# Patient Record
Sex: Male | Born: 1955 | Race: White | Hispanic: No | Marital: Married | Smoking: Never smoker
Health system: Southern US, Community
[De-identification: ages and names within clinical notes are randomized; demographics above are authoritative.]

## PROBLEM LIST (undated history)

## (undated) DIAGNOSIS — I10 Essential (primary) hypertension: Secondary | ICD-10-CM

## (undated) HISTORY — PX: CHOLECYSTECTOMY: SHX55

## (undated) HISTORY — PX: HERNIA REPAIR: SHX51

---

## 2018-04-19 ENCOUNTER — Emergency Department (HOSPITAL_COMMUNITY)
Admission: EM | Admit: 2018-04-19 | Discharge: 2018-04-19 | Disposition: A | Discharge status: Home / Self Care | Payer: Worker's Compensation | Attending: Emergency Medicine | Admitting: Emergency Medicine

## 2018-04-19 ENCOUNTER — Emergency Department (HOSPITAL_COMMUNITY): Payer: Worker's Compensation

## 2018-04-19 ENCOUNTER — Encounter (HOSPITAL_COMMUNITY): Payer: Self-pay | Admitting: Neurology

## 2018-04-19 DIAGNOSIS — Z79899 Other long term (current) drug therapy: Secondary | ICD-10-CM | POA: Diagnosis not present

## 2018-04-19 DIAGNOSIS — Y990 Civilian activity done for income or pay: Secondary | ICD-10-CM | POA: Insufficient documentation

## 2018-04-19 DIAGNOSIS — Y9289 Other specified places as the place of occurrence of the external cause: Secondary | ICD-10-CM | POA: Insufficient documentation

## 2018-04-19 DIAGNOSIS — W228XXA Striking against or struck by other objects, initial encounter: Secondary | ICD-10-CM | POA: Diagnosis not present

## 2018-04-19 DIAGNOSIS — I10 Essential (primary) hypertension: Secondary | ICD-10-CM | POA: Diagnosis not present

## 2018-04-19 DIAGNOSIS — Z7982 Long term (current) use of aspirin: Secondary | ICD-10-CM | POA: Insufficient documentation

## 2018-04-19 DIAGNOSIS — M25562 Pain in left knee: Secondary | ICD-10-CM | POA: Insufficient documentation

## 2018-04-19 DIAGNOSIS — Y9389 Activity, other specified: Secondary | ICD-10-CM | POA: Insufficient documentation

## 2018-04-19 HISTORY — DX: Essential (primary) hypertension: I10

## 2018-04-19 LAB — COMPREHENSIVE METABOLIC PANEL
ALT: 30 U/L (ref 0–44)
AST: 26 U/L (ref 15–41)
Albumin: 4.1 g/dL (ref 3.5–5.0)
Alkaline Phosphatase: 46 U/L (ref 38–126)
Anion gap: 6 (ref 5–15)
BILIRUBIN TOTAL: 1 mg/dL (ref 0.3–1.2)
BUN: 16 mg/dL (ref 8–23)
CHLORIDE: 109 mmol/L (ref 98–111)
CO2: 24 mmol/L (ref 22–32)
CREATININE: 1.09 mg/dL (ref 0.61–1.24)
Calcium: 8.8 mg/dL — ABNORMAL LOW (ref 8.9–10.3)
Glucose, Bld: 114 mg/dL — ABNORMAL HIGH (ref 70–99)
Potassium: 3.5 mmol/L (ref 3.5–5.1)
Sodium: 139 mmol/L (ref 135–145)
TOTAL PROTEIN: 6.3 g/dL — AB (ref 6.5–8.1)

## 2018-04-19 LAB — ETHANOL

## 2018-04-19 LAB — SAMPLE TO BLOOD BANK

## 2018-04-19 LAB — I-STAT CHEM 8, ED
BUN: 20 mg/dL (ref 8–23)
CREATININE: 1.1 mg/dL (ref 0.61–1.24)
Calcium, Ion: 1.11 mmol/L — ABNORMAL LOW (ref 1.15–1.40)
Chloride: 106 mmol/L (ref 98–111)
GLUCOSE: 109 mg/dL — AB (ref 70–99)
HCT: 42 % (ref 39.0–52.0)
HEMOGLOBIN: 14.3 g/dL (ref 13.0–17.0)
POTASSIUM: 3.6 mmol/L (ref 3.5–5.1)
Sodium: 141 mmol/L (ref 135–145)
TCO2: 27 mmol/L (ref 22–32)

## 2018-04-19 LAB — CDS SEROLOGY

## 2018-04-19 LAB — CBC
HCT: 43.3 % (ref 39.0–52.0)
Hemoglobin: 14.6 g/dL (ref 13.0–17.0)
MCH: 30.1 pg (ref 26.0–34.0)
MCHC: 33.7 g/dL (ref 30.0–36.0)
MCV: 89.3 fL (ref 80.0–100.0)
NRBC: 0 % (ref 0.0–0.2)
PLATELETS: 224 10*3/uL (ref 150–400)
RBC: 4.85 MIL/uL (ref 4.22–5.81)
RDW: 12.4 % (ref 11.5–15.5)
WBC: 6.7 10*3/uL (ref 4.0–10.5)

## 2018-04-19 LAB — PROTIME-INR
INR: 0.96
Prothrombin Time: 12.7 seconds (ref 11.4–15.2)

## 2018-04-19 LAB — I-STAT CG4 LACTIC ACID, ED: LACTIC ACID, VENOUS: 1.04 mmol/L (ref 0.5–1.9)

## 2018-04-19 MED ORDER — HYDROCODONE-ACETAMINOPHEN 5-325 MG PO TABS: 1.0000 | ORAL_TABLET | Freq: Four times a day (QID) | ORAL | 0 refills | Status: AC | PRN

## 2018-04-19 MED ORDER — HYDROCODONE-ACETAMINOPHEN 5-325 MG PO TABS
1.0000 | ORAL_TABLET | Freq: Once | ORAL | Status: AC
  Administered 2018-04-19: 1 via ORAL
  Filled 2018-04-19: qty 1

## 2018-04-19 MED ORDER — KETOROLAC TROMETHAMINE 30 MG/ML IJ SOLN
30.0000 mg | Freq: Once | INTRAMUSCULAR | Status: AC
  Administered 2018-04-19: 30 mg via INTRAVENOUS
  Filled 2018-04-19: qty 1

## 2018-04-19 NOTE — ED Notes (Signed)
c-collar removed per MD. Plan to ambulate patient after pain med admin.

## 2018-04-19 NOTE — Progress Notes (Signed)
Chaplain responded to Level 2 trauma.  62 year old male pinned under equipment. Chaplain provided ministry of presence to pt and contacted his wife who is on her way.  Will follow. Lynnell ChadVirginia Berenice Oehlert  [phone]

## 2018-04-19 NOTE — ED Notes (Signed)
No crutches available in ED at this time. Ortho tech paged for crutches.

## 2018-04-19 NOTE — ED Triage Notes (Addendum)
Per ems- pt was operating a roller machine and it started rolling over so he jumped out of the cab and the cab landed on his lower extremities. He coworkers got a piece of equipment and removed the equipment that was on top of him (took about 15 mins). Has deformity to left knee, splint applied to LE. C-collar in place, c/o lower back pain. Given 150 mcg fentanyl. BP 130 systolic , HR 70. Pt was outside in the rain, clothes are wet.

## 2018-04-19 NOTE — ED Notes (Signed)
Pt ambulated a few steps in room. Able to bear weight, but painful to left knee.

## 2018-04-19 NOTE — ED Notes (Signed)
Water given  

## 2018-04-19 NOTE — ED Notes (Signed)
Pt's wife arrived at bedside, x-ray obtaining portable x-rays. Chaplain is with his wife. Pt is a x 4. Reports 2/10 pain. Pain sites=left knee, right lower leg, lower back pain.

## 2018-04-19 NOTE — Progress Notes (Signed)
Orthopedic Tech Progress Note Patient Details:  Derek Bowers [birth year] [id number]  Ortho Devices Type of Ortho Device: Knee Immobilizer Ortho Device/Splint Location: lle Ortho Device/Splint Interventions: Ordered, Application   Post Interventions Patient Tolerated: Well Instructions Provided: Care of device, Adjustment of device   [name], [name] J 04/19/2018, 1:27 PM

## 2018-04-19 NOTE — ED Provider Notes (Signed)
7:04 PM Assumed care from Dr. Dalene SeltzerSchlossman, please see their note for full history, physical and decision making until this point. In brief this is a 62 y.o. year old male who presented to the ED tonight with Trauma  Severe trauma to the left leg with persistent pain with walking so pending CT scan to look for occult fracture.  CT scan shows that the patient has likely strain versus sprain small fluid collection associated with that but no obvious fractures will give crutches, pain medicine and Artie has a knee immobilizer on.  Will need follow-up with primary doctor or orthopedics in 1 week if not improving to make sure that he gets an MRI.     Discharge instructions, including strict return precautions for new or worsening symptoms, given. Patient and/or family verbalized understanding and agreement with the plan as described.   Labs, studies and imaging reviewed by myself and considered in medical decision making if ordered. Imaging interpreted by radiology.  Labs Reviewed  COMPREHENSIVE METABOLIC PANEL - Abnormal; Notable for the following components:      Result Value   Glucose, Bld 114 (*)    Calcium 8.8 (*)    Total Protein 6.3 (*)    All other components within normal limits  I-STAT CHEM 8, ED - Abnormal; Notable for the following components:   Glucose, Bld 109 (*)    Calcium, Ion 1.11 (*)    All other components within normal limits  CBC  ETHANOL  PROTIME-INR  CDS SEROLOGY  I-STAT CG4 LACTIC ACID, ED  SAMPLE TO BLOOD BANK    CT Tibia Fibula Left Wo Contrast  Final Result    DG Tibia/Fibula Right  Final Result    DG Knee Left Port  Final Result    DG Pelvis Portable  Final Result    DG Chest Port 1 View  Final Result      No follow-ups on file.    Marily MemosMesner, Gael Delude, MD 04/19/18 [phone]

## 2018-04-23 NOTE — ED Provider Notes (Signed)
[facility] EMERGENCY DEPARTMENT Provider Note   CSN: [id number] Arrival date & time: 04/19/18  1212     History   Chief Complaint Chief Complaint  Patient presents with  . Trauma    HPI Derek Bowers is a 62 y.o. male.  HPI   Presents as a levl II trauma after he lost control of riding roller machine and he jumped approximately 3-4 feet out of the cab and the machine rolled onto his distal lower extremities.  Equipment that was on top of him removed within 15 minutes.  Had reported deformity to left knee, has sacral apain and pain to right lower leg and left knee.  No numbness. No chest pain, dyspnea, abdominal pain, n/v, weakness, headache. Denies hitting head or head trauma, denies LOC. Not on anticoagulation.    Past Medical History:  Diagnosis Date  . Hypertension     There are no active problems to display for this patient.   Past Surgical History:  Procedure Laterality Date  . CHOLECYSTECTOMY    . HERNIA REPAIR          Home Medications    Prior to Admission medications   Medication Sig Start Date End Date Taking? Authorizing Provider  alendronate (FOSAMAX) 35 MG tablet Take 35 mg by mouth once a week. Tuesday 03/12/18  Yes [provider]  amLODipine (NORVASC) 10 MG tablet Take 10 mg by mouth daily.   Yes [provider]  aspirin EC 81 MG tablet Take 81 mg by mouth daily.   Yes [provider]  atorvastatin (LIPITOR) 40 MG tablet Take 40 mg by mouth daily. 03/11/18  Yes [provider]  B Complex-C (SUPER B COMPLEX PO) Take 1 tablet by mouth daily.   Yes [provider]  cholecalciferol (VITAMIN D3) 25 MCG (1000 UT) tablet Take 1,000 Units by mouth daily.   Yes [provider]  DULoxetine (CYMBALTA) 60 MG capsule Take 60 mg by mouth daily. 02/05/18  Yes [provider]  fluticasone (FLONASE) 50 MCG/ACT nasal spray Place 1-2 sprays into both nostrils daily. 04/30/16  Yes  [provider]  loratadine (ALLERGY) 10 MG tablet Take 10 mg by mouth daily.   Yes [provider]  metoprolol succinate (TOPROL-XL) 50 MG 24 hr tablet Take 50 mg by mouth daily. 03/11/18  Yes [provider]  montelukast (SINGULAIR) 10 MG tablet Take 10 mg by mouth daily. 03/26/18  Yes [provider]  Multiple Vitamin (MULTIVITAMIN WITH MINERALS) TABS tablet Take 1 tablet by mouth daily.   Yes [provider]  Omega-3 Fatty Acids (FISH OIL) 1000 MG CAPS Take 1,000 mg by mouth daily.   Yes [provider]  Turmeric Curcumin 500 MG CAPS Take 500 mg by mouth daily.   Yes [provider]  HYDROcodone-acetaminophen (NORCO/VICODIN) 5-325 MG tablet Take 1-2 tablets by mouth every 6 (six) hours as needed for severe pain. 04/19/18   Mesner, Barbara Cower, MD    Family History No family history on file.  Social History Social History   Tobacco Use  . Smoking status: Never Smoker  . Smokeless tobacco: Never Used  Substance Use Topics  . Alcohol use: Not Currently  . Drug use: Not on file     Allergies   Codeine   Review of Systems Review of Systems  Constitutional: Negative for fever.  HENT: Negative for sore throat.   Eyes: Negative for visual disturbance.  Respiratory: Negative for shortness of breath.   Cardiovascular:  Negative for chest pain.  Gastrointestinal: Negative for abdominal pain, nausea and vomiting.  Genitourinary: Negative for difficulty urinating.  Musculoskeletal: Positive for arthralgias, back pain (low back/sacrum) and joint swelling. Negative for neck pain and neck stiffness.  Skin: Negative for rash.  Neurological: Negative for syncope, weakness, numbness and headaches.     Physical Exam Updated Vital Signs BP (!) 143/87   Pulse 66   Temp (!) 97.3 F (36.3 C) (Oral)   Resp 18   Ht 5\' 10"  (1.778 m)   Wt 93 kg   SpO2 95%   BMI 29.41 kg/m   Physical Exam  Constitutional: He is oriented to  person, place, and time. He appears well-developed and well-nourished. No distress.  HENT:  Head: Normocephalic and atraumatic.  Eyes: Conjunctivae and EOM are normal.  Neck: Normal range of motion.  Cardiovascular: Normal rate, regular rhythm, normal heart sounds and intact distal pulses. Exam reveals no gallop and no friction rub.  No murmur heard. Pulmonary/Chest: Effort normal and breath sounds normal. No respiratory distress. He has no wheezes. He has no rales.  Abdominal: Soft. He exhibits no distension. There is no tenderness. There is no guarding.  Musculoskeletal: He exhibits no edema.       Left knee: He exhibits normal range of motion (able to flex and extend knee, hold knee in extension). Tenderness found. Medial joint line tenderness noted.       Cervical back: He exhibits no bony tenderness.       Thoracic back: He exhibits no bony tenderness.       Lumbar back: He exhibits tenderness (sacrum).       Right lower leg: He exhibits tenderness and bony tenderness. He exhibits no deformity. Lacerations: abrasion.  Neurological: He is alert and oriented to person, place, and time.  Skin: Skin is warm and dry. He is not diaphoretic.  Nursing note and vitals reviewed.    ED Treatments / Results  Labs (all labs ordered are listed, but only abnormal results are displayed) Labs Reviewed  COMPREHENSIVE METABOLIC PANEL - Abnormal; Notable for the following components:      Result Value   Glucose, Bld 114 (*)    Calcium 8.8 (*)    Total Protein 6.3 (*)    All other components within normal limits  I-STAT CHEM 8, ED - Abnormal; Notable for the following components:   Glucose, Bld 109 (*)    Calcium, Ion 1.11 (*)    All other components within normal limits  CDS SEROLOGY  CBC  ETHANOL  PROTIME-INR  I-STAT CG4 LACTIC ACID, ED  SAMPLE TO BLOOD BANK    EKG None  Radiology No results found.  Procedures Procedures (including critical care time)  Medications Ordered in  ED Medications  ketorolac (TORADOL) 30 MG/ML injection 30 mg (30 mg Intravenous Given 04/19/18 1433)  HYDROcodone-acetaminophen (NORCO/VICODIN) 5-325 MG per tablet 1 tablet (1 tablet Oral Given 04/19/18 1906)     Initial Impression / Assessment and Plan / ED Course  I have reviewed the triage vital signs and the nursing notes.  Pertinent labs & imaging results that were available during my care of the patient were reviewed by me and considered in my medical decision making (see chart for details).     62yo male presents as a level II trauma after he lost control of riding roller machine and he jumped approximately 3-4 feet out of the cab and the machine rolled onto his distal lower extremities.  Vital signs stable,  XR of chest and pelvis show no abnormalities.  Patient without head trauma, no LOC, no headache, no neck pain, no signs of injuries to chest, abdomen and have low suspicion for intracranial bleed, cervical spine fracture, or other injuries to chest or abdomen. Do not feel he has distracting injuries and he is NEXUS negative. Reports sacral pain, pelvic XR shows no sign of fracture.  Has bilateral lower leg pain, XR show no fracture. Good knee extension, doubt patellar tendon injury. Compartments soft, low suspicion for rhabdo/crush injury given limited time of extrication. He is NV intact. Given medial tibial plateau tenderness, will obtain CT of knee. If negative, will discharge with knee immobilizer, recommend orthopedic follow up. Discussed need for close follow up and reasons to return.   Final Clinical Impressions(s) / ED Diagnoses   Final diagnoses:  Acute pain of left knee    ED Discharge Orders         Ordered    HYDROcodone-acetaminophen (NORCO/VICODIN) 5-325 MG tablet  Every 6 hours PRN     04/19/18 1854           Alvira MondaySchlossman, Herminio Kniskern, MD 04/23/18 1132

## 2020-01-08 IMAGING — DX DG CHEST 1V PORT
1 series · 1 of 1 positions shown · non-contrast
Comparison: None.

CLINICAL DATA: Level 2 trauma.

EXAM:
PORTABLE CHEST 1 VIEW

[chest ap]
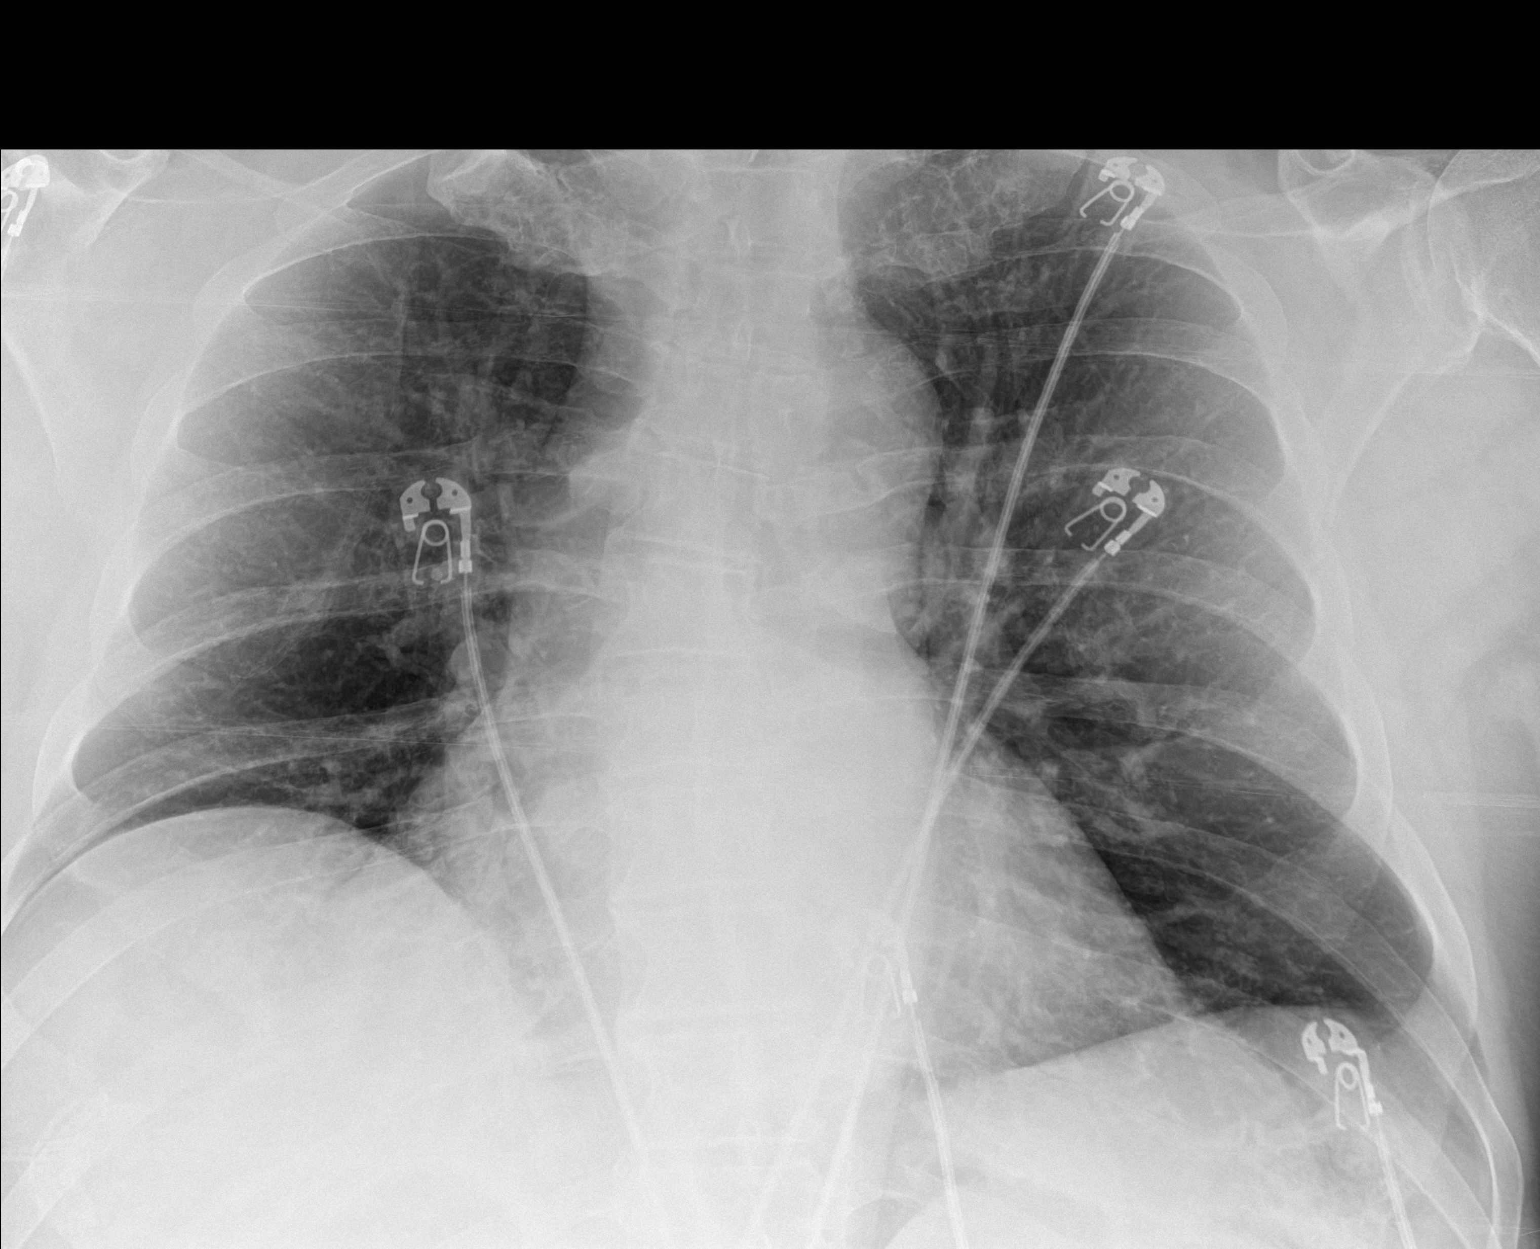

[1 of 1 positions shown; findings below may reference images not displayed]

FINDINGS: Low volume chest. Mediastinal contours within normal limits.
Elevated right diaphragm with probable atelectasis. No visible
hemothorax or pneumothorax. No visible fracture. Dextrocurvature of
the thoracic spine.
IMPRESSION: Low volume chest without definite intrathoracic injury.

## 2020-01-08 IMAGING — DX DG TIBIA/FIBULA 2V*R*
4 series · 4 of 4 positions shown · non-contrast
Comparison: None

CLINICAL DATA: RIGHT knee pain with bruising, level 2 trauma,
pinned under equipment

EXAM:
RIGHT TIBIA AND FIBULA - 2 VIEW

[tibia ap (1 of 2)]
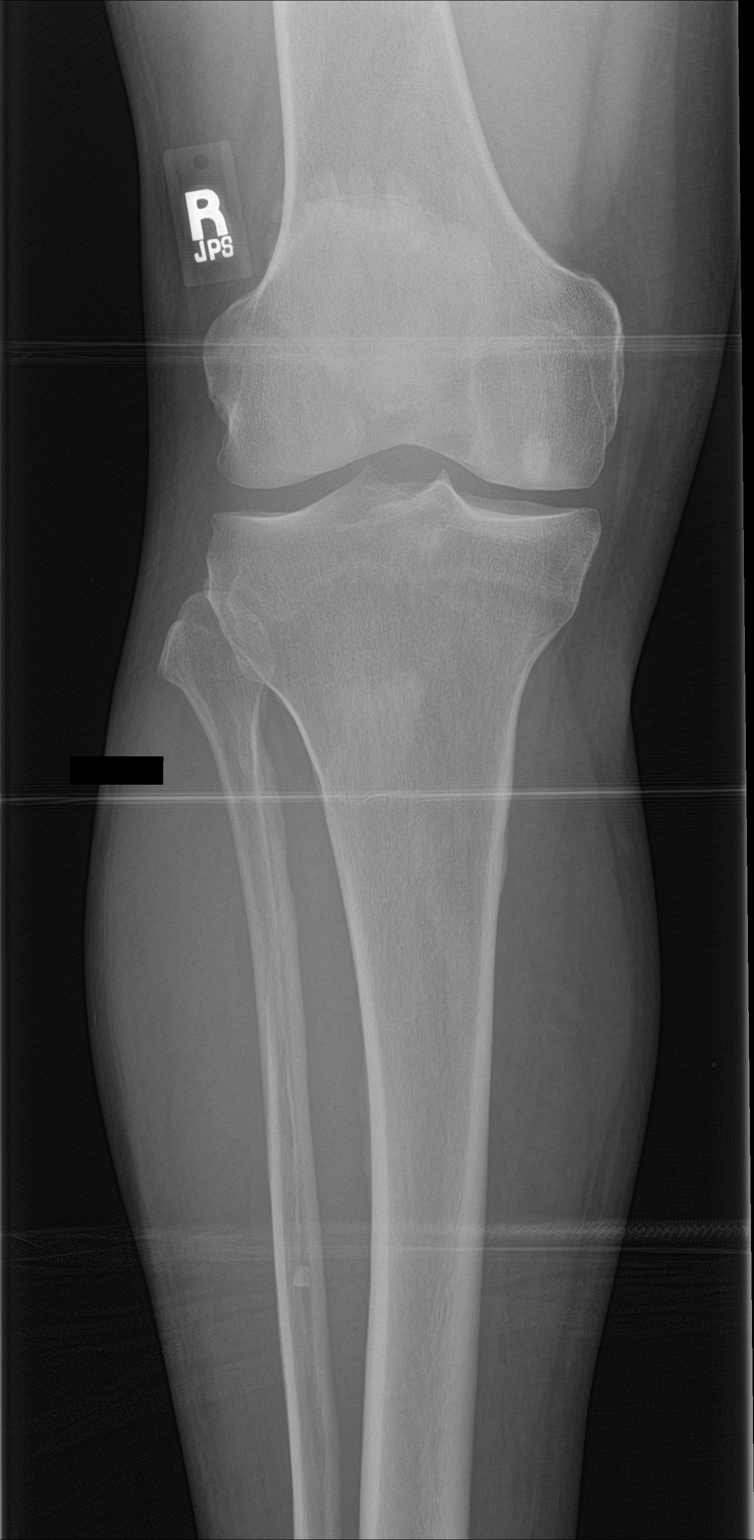

[tibia ap (2 of 2)]
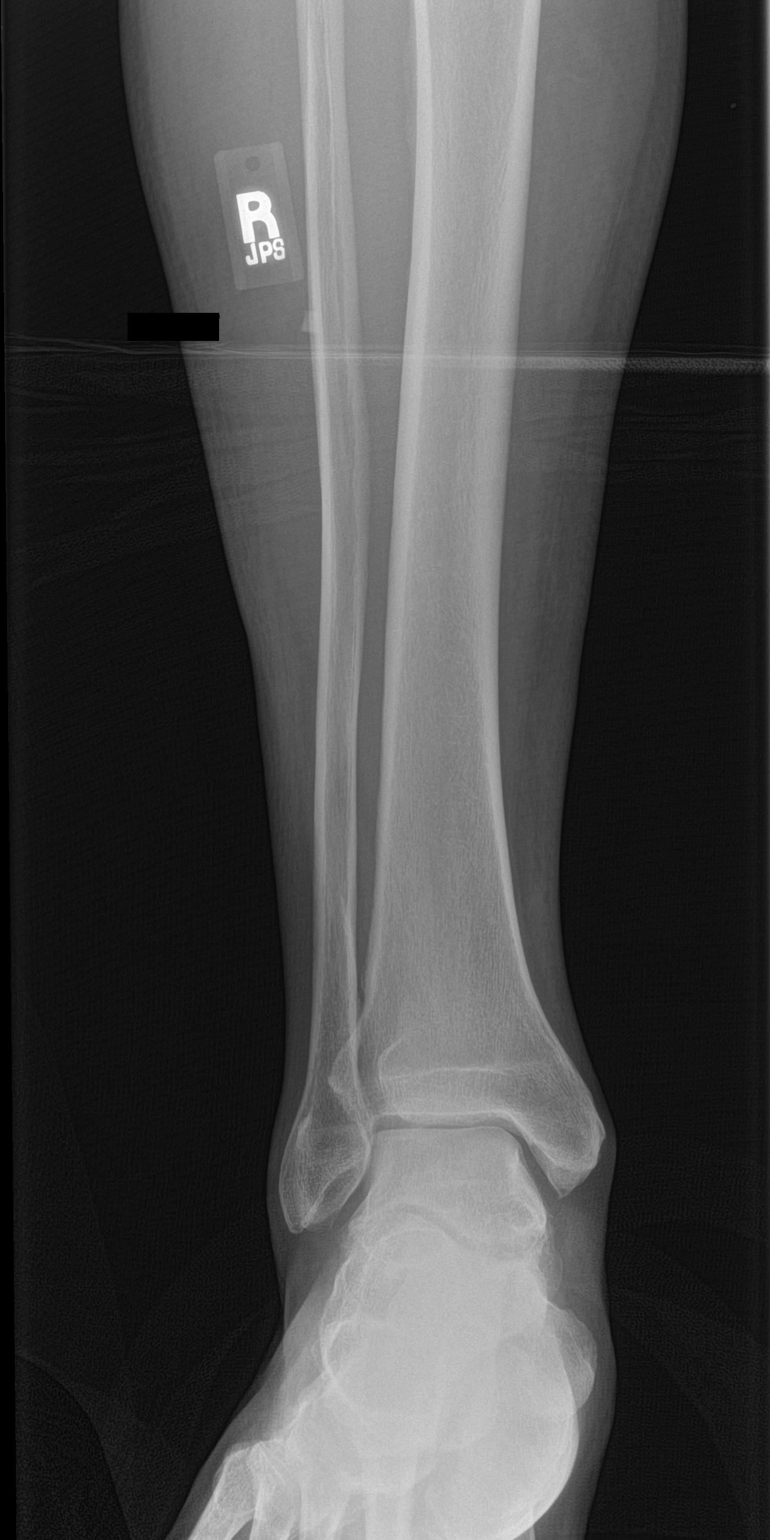

[tibia lat (1 of 2)]
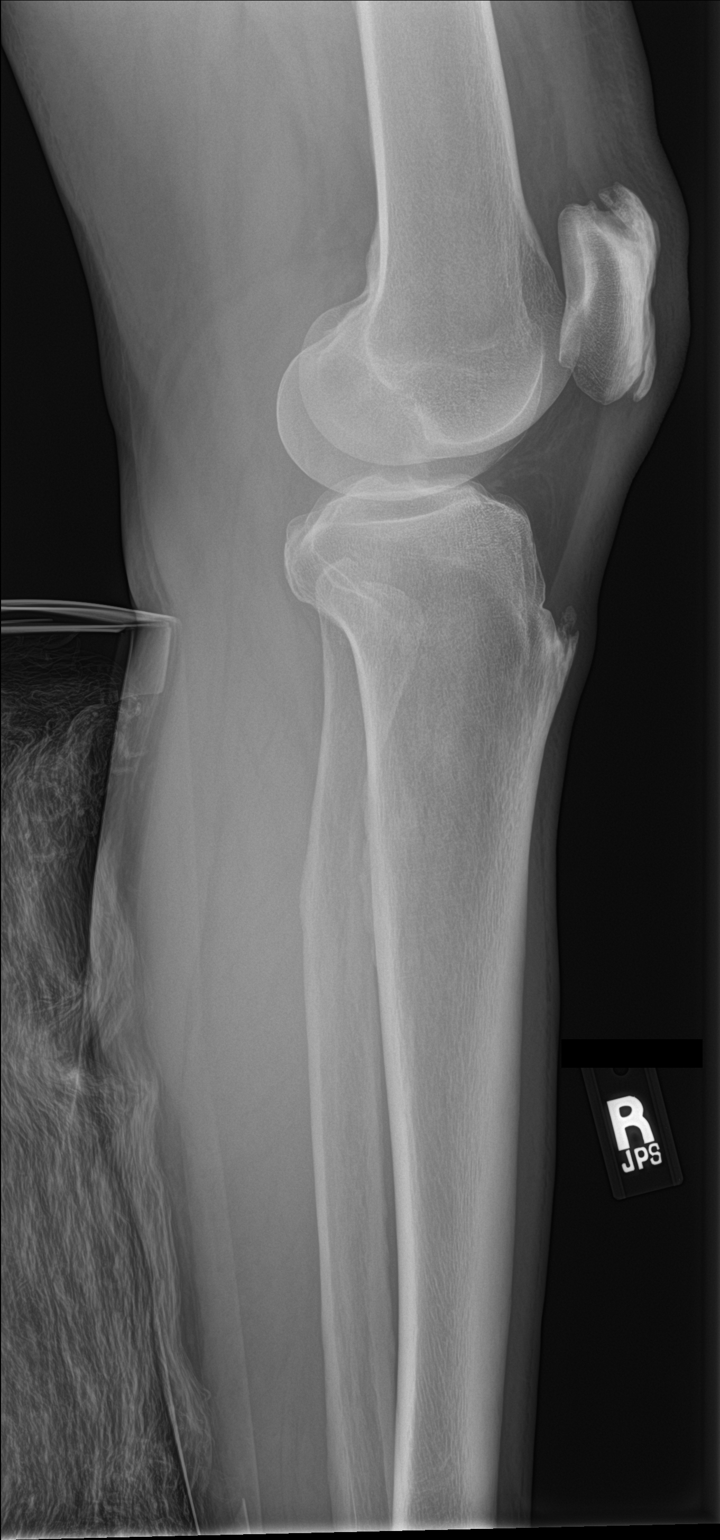

[tibia lat (2 of 2)]
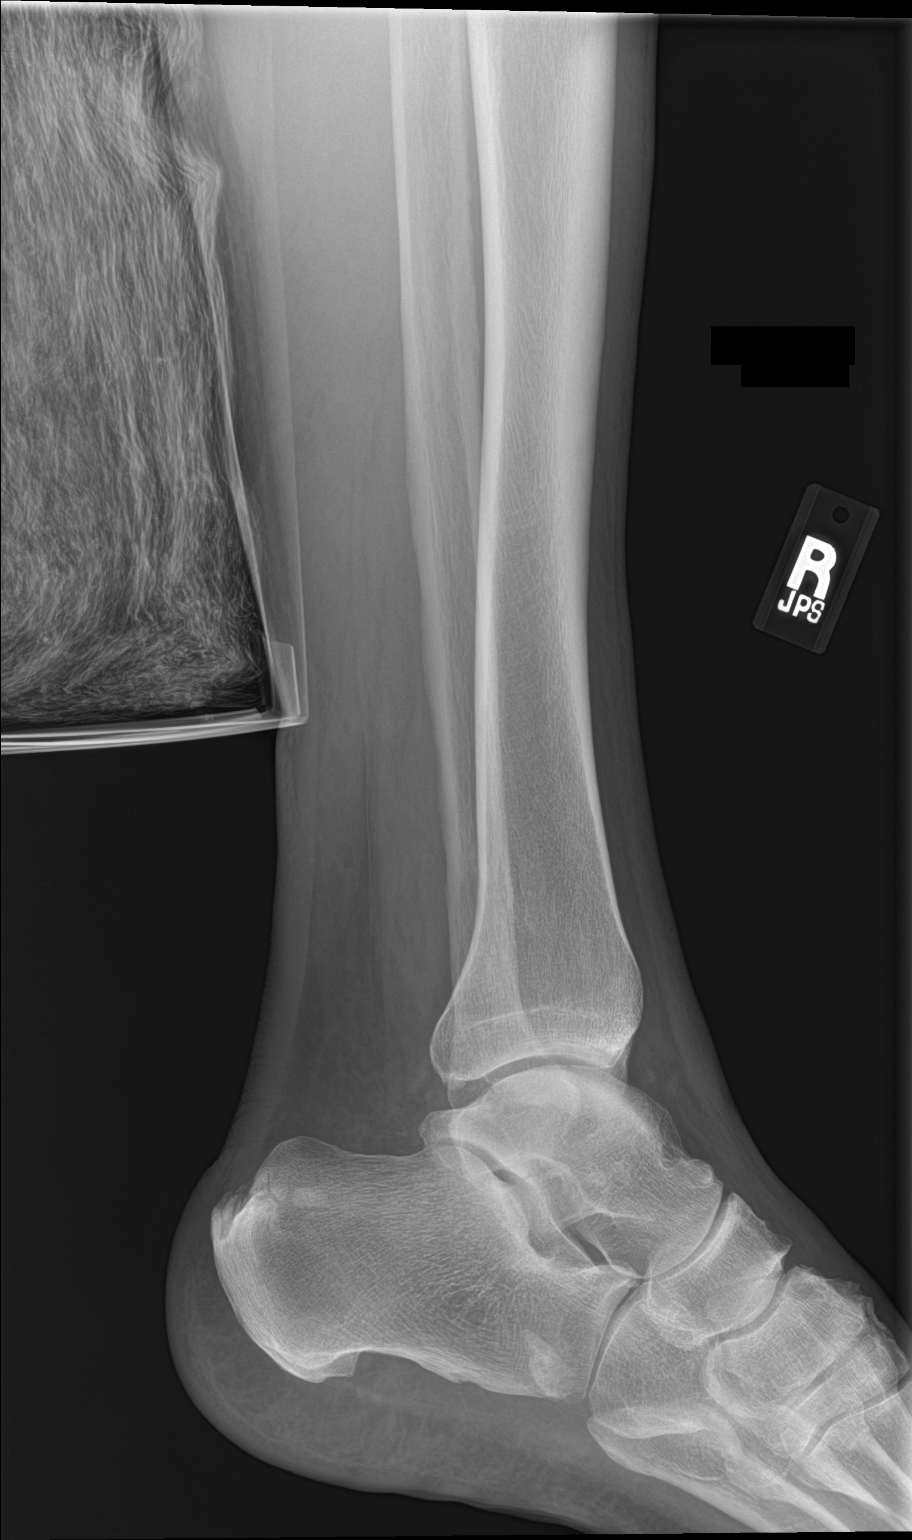

[4 of 4 positions shown; findings below may reference images not displayed]

FINDINGS: Osseous mineralization normal.

Bone island at medial femoral condyle.

Joint spaces preserved.

Patellar spurring at quadriceps and patellar tendon insertions.

No acute fracture, dislocation, or bone destruction.

5 x 5 mm diameter angular radiopaque foreign body projects over the
mid RIGHT lower leg, question glass fragment, not definitely seen on
lateral view; this could be internal or external to the patient..
IMPRESSION: No acute osseous abnormalities.

Question angular radiopaque foreign body 5 x 5 mm at mid lower leg,
question glass fragment.
